# Patient Record
Sex: Female | Born: 1940
Health system: Southern US, Community
[De-identification: ages and names within clinical notes are randomized; demographics above are authoritative.]

## PROBLEM LIST (undated history)

## (undated) DIAGNOSIS — E119 Type 2 diabetes mellitus without complications: Secondary | ICD-10-CM

## (undated) DIAGNOSIS — D259 Leiomyoma of uterus, unspecified: Secondary | ICD-10-CM

## (undated) DIAGNOSIS — R002 Palpitations: Secondary | ICD-10-CM

## (undated) DIAGNOSIS — E079 Disorder of thyroid, unspecified: Secondary | ICD-10-CM

## (undated) DIAGNOSIS — E785 Hyperlipidemia, unspecified: Secondary | ICD-10-CM

## (undated) DIAGNOSIS — T7840XA Allergy, unspecified, initial encounter: Secondary | ICD-10-CM

## (undated) DIAGNOSIS — J45909 Unspecified asthma, uncomplicated: Secondary | ICD-10-CM

## (undated) DIAGNOSIS — I10 Essential (primary) hypertension: Secondary | ICD-10-CM

## (undated) DIAGNOSIS — R609 Edema, unspecified: Secondary | ICD-10-CM

## (undated) HISTORY — DX: Type 2 diabetes mellitus without complications: E11.9

## (undated) HISTORY — DX: Essential (primary) hypertension: I10

## (undated) HISTORY — DX: Allergy, unspecified, initial encounter: T78.40XA

## (undated) HISTORY — DX: Disorder of thyroid, unspecified: E07.9

## (undated) HISTORY — DX: Palpitations: R00.2

## (undated) HISTORY — DX: Unspecified asthma, uncomplicated: J45.909

## (undated) HISTORY — DX: Hyperlipidemia, unspecified: E78.5

## (undated) HISTORY — DX: Edema, unspecified: R60.9

## (undated) HISTORY — DX: Leiomyoma of uterus, unspecified: D25.9

---

## 1970-11-09 HISTORY — PX: BREAST SURGERY: SHX581

## 1999-03-20 ENCOUNTER — Other Ambulatory Visit: Admission: RE | Admit: 1999-03-20 | Discharge: 1999-03-20 | Payer: Self-pay | Admitting: Family Medicine

## 2004-07-10 ENCOUNTER — Emergency Department (HOSPITAL_COMMUNITY): Admission: EM | Admit: 2004-07-10 | Discharge: 2004-07-10 | Payer: Self-pay | Admitting: Emergency Medicine

## 2005-02-28 ENCOUNTER — Emergency Department (HOSPITAL_COMMUNITY): Admission: EM | Admit: 2005-02-28 | Discharge: 2005-02-28 | Payer: Self-pay | Admitting: Internal Medicine

## 2005-03-07 ENCOUNTER — Ambulatory Visit (HOSPITAL_COMMUNITY): Admission: RE | Admit: 2005-03-07 | Discharge: 2005-03-07 | Payer: Self-pay | Admitting: *Deleted

## 2005-06-03 ENCOUNTER — Other Ambulatory Visit: Admission: RE | Admit: 2005-06-03 | Discharge: 2005-06-03 | Payer: Self-pay | Admitting: Family Medicine

## 2005-06-25 ENCOUNTER — Ambulatory Visit (HOSPITAL_COMMUNITY): Admission: RE | Admit: 2005-06-25 | Discharge: 2005-06-25 | Payer: Self-pay | Admitting: Family Medicine

## 2010-11-30 ENCOUNTER — Encounter: Payer: Self-pay | Admitting: Family Medicine

## 2017-09-03 DIAGNOSIS — I10 Essential (primary) hypertension: Secondary | ICD-10-CM | POA: Diagnosis not present

## 2017-09-03 DIAGNOSIS — E119 Type 2 diabetes mellitus without complications: Secondary | ICD-10-CM | POA: Diagnosis not present

## 2017-09-08 DIAGNOSIS — R9431 Abnormal electrocardiogram [ECG] [EKG]: Secondary | ICD-10-CM | POA: Diagnosis not present

## 2017-09-08 DIAGNOSIS — I358 Other nonrheumatic aortic valve disorders: Secondary | ICD-10-CM | POA: Diagnosis not present

## 2017-09-08 DIAGNOSIS — I10 Essential (primary) hypertension: Secondary | ICD-10-CM | POA: Diagnosis not present

## 2017-09-17 DIAGNOSIS — I10 Essential (primary) hypertension: Secondary | ICD-10-CM | POA: Diagnosis not present

## 2017-09-20 DIAGNOSIS — R9431 Abnormal electrocardiogram [ECG] [EKG]: Secondary | ICD-10-CM | POA: Diagnosis not present

## 2017-09-20 DIAGNOSIS — I10 Essential (primary) hypertension: Secondary | ICD-10-CM | POA: Diagnosis not present

## 2017-09-27 DIAGNOSIS — R9431 Abnormal electrocardiogram [ECG] [EKG]: Secondary | ICD-10-CM | POA: Diagnosis not present

## 2017-09-27 DIAGNOSIS — I10 Essential (primary) hypertension: Secondary | ICD-10-CM | POA: Diagnosis not present

## 2017-09-29 DIAGNOSIS — I351 Nonrheumatic aortic (valve) insufficiency: Secondary | ICD-10-CM | POA: Diagnosis not present

## 2017-09-29 DIAGNOSIS — R9431 Abnormal electrocardiogram [ECG] [EKG]: Secondary | ICD-10-CM | POA: Diagnosis not present

## 2017-09-29 DIAGNOSIS — R0602 Shortness of breath: Secondary | ICD-10-CM | POA: Diagnosis not present

## 2017-09-29 DIAGNOSIS — I10 Essential (primary) hypertension: Secondary | ICD-10-CM | POA: Diagnosis not present

## 2017-10-05 DIAGNOSIS — I10 Essential (primary) hypertension: Secondary | ICD-10-CM | POA: Diagnosis not present

## 2017-10-07 DIAGNOSIS — R1900 Intra-abdominal and pelvic swelling, mass and lump, unspecified site: Secondary | ICD-10-CM | POA: Diagnosis not present

## 2017-10-07 DIAGNOSIS — Z124 Encounter for screening for malignant neoplasm of cervix: Secondary | ICD-10-CM | POA: Diagnosis not present

## 2017-10-07 DIAGNOSIS — Z01419 Encounter for gynecological examination (general) (routine) without abnormal findings: Secondary | ICD-10-CM | POA: Diagnosis not present

## 2017-10-08 ENCOUNTER — Other Ambulatory Visit: Payer: Self-pay | Admitting: Obstetrics & Gynecology

## 2017-10-08 DIAGNOSIS — D259 Leiomyoma of uterus, unspecified: Secondary | ICD-10-CM

## 2017-10-14 DIAGNOSIS — M810 Age-related osteoporosis without current pathological fracture: Secondary | ICD-10-CM | POA: Diagnosis not present

## 2017-10-14 DIAGNOSIS — Z1231 Encounter for screening mammogram for malignant neoplasm of breast: Secondary | ICD-10-CM | POA: Diagnosis not present

## 2017-10-26 DIAGNOSIS — Z1211 Encounter for screening for malignant neoplasm of colon: Secondary | ICD-10-CM | POA: Diagnosis not present

## 2017-10-26 DIAGNOSIS — Z1212 Encounter for screening for malignant neoplasm of rectum: Secondary | ICD-10-CM | POA: Diagnosis not present

## 2017-10-29 ENCOUNTER — Ambulatory Visit
Admission: RE | Admit: 2017-10-29 | Discharge: 2017-10-29 | Disposition: A | Discharge status: Home / Self Care | Payer: Medicare Other | Source: Ambulatory Visit | Attending: Obstetrics & Gynecology | Admitting: Obstetrics & Gynecology

## 2017-10-29 DIAGNOSIS — D259 Leiomyoma of uterus, unspecified: Secondary | ICD-10-CM

## 2017-10-29 MED ORDER — GADOBENATE DIMEGLUMINE 529 MG/ML IV SOLN
13.0000 mL | Freq: Once | INTRAVENOUS | Status: AC | PRN
  Administered 2017-10-29: 13 mL via INTRAVENOUS

## 2017-11-08 DIAGNOSIS — I10 Essential (primary) hypertension: Secondary | ICD-10-CM | POA: Diagnosis not present

## 2017-11-10 ENCOUNTER — Encounter: Payer: Self-pay | Admitting: Gynecologic Oncology

## 2017-11-10 ENCOUNTER — Ambulatory Visit: Payer: Medicare Other | Attending: Gynecologic Oncology | Admitting: Gynecologic Oncology

## 2017-11-10 VITALS — BP 148/80 | HR 59 | Temp 97.8°F | Resp 18 | Ht 60.0 in | Wt 134.5 lb

## 2017-11-10 DIAGNOSIS — E785 Hyperlipidemia, unspecified: Secondary | ICD-10-CM | POA: Insufficient documentation

## 2017-11-10 DIAGNOSIS — N951 Menopausal and female climacteric states: Secondary | ICD-10-CM | POA: Diagnosis not present

## 2017-11-10 DIAGNOSIS — Z91013 Allergy to seafood: Secondary | ICD-10-CM | POA: Insufficient documentation

## 2017-11-10 DIAGNOSIS — I10 Essential (primary) hypertension: Secondary | ICD-10-CM | POA: Insufficient documentation

## 2017-11-10 DIAGNOSIS — E119 Type 2 diabetes mellitus without complications: Secondary | ICD-10-CM | POA: Diagnosis not present

## 2017-11-10 DIAGNOSIS — E079 Disorder of thyroid, unspecified: Secondary | ICD-10-CM | POA: Insufficient documentation

## 2017-11-10 DIAGNOSIS — D251 Intramural leiomyoma of uterus: Secondary | ICD-10-CM | POA: Diagnosis not present

## 2017-11-10 DIAGNOSIS — Z79899 Other long term (current) drug therapy: Secondary | ICD-10-CM | POA: Diagnosis not present

## 2017-11-10 DIAGNOSIS — J45909 Unspecified asthma, uncomplicated: Secondary | ICD-10-CM | POA: Diagnosis not present

## 2017-11-10 NOTE — Patient Instructions (Signed)
Dr. Alycia Rossetti recommends a complete hysterectomy with ovaries and tubes removed.  She will send Dr. Benjie Karvonen as well as your PCP and Cardiologist her note from today with recommendations. Dr. Benjie Karvonen is able to perform this surgery for you as Dr. Alycia Rossetti stated in the visit. Call our office [phone] if you have any questions.

## 2017-11-10 NOTE — Progress Notes (Signed)
Consult Note: Gyn-Onc  Becki Mccaskill 77 y.o. female  CC:  Chief Complaint  Patient presents with  . Fibroids, intramural    HPI: Patient is seen today in consultation at the request of Dr. Benjie Karvonen. Primary physician Dr. Beatrix Shipper. Cardiologist Dr. Koleen Nimrod.  Patient is a very pleasant 77 year old gravida 1 para 1 who is referred to [place] secondary to uterine fibroids. The long history of uterine myomas that she feels really stopped getting bigger about 4 years ago. Please review her imaging results below.  07/10/04: CT: Markedly enlarged uterus.  Large dense calcifications in both ovaries.  06/25/05 ultrasound: The uterus is enlarged measuring at least 13 cm in length x 9.0 cm in AP diameter x 10.0 cm in transverse diameter.  A large fibroid is seen centrally within the uterus which measures approximately 8.7 x 8.0 x 7.7 cm.  This is located centrally in the uterus and likely displaces the endometrial cavity.  Endometrium was not visualized by either transabdominal or transvaginal sonography. In addition, there are calcified masses in the posterolateral aspect of the uterine body on both the right and left sides with the right side measuring approximately 3.4 x 1.8 x 2.4 cm and the left side measuring approximately 3.0 x 3.1 x 2.0 cm.  These are consistent with calcified fibroids, and are stable when compared with prior CT on 07/10/04.   Neither ovary is directly visualized by either transabdominal or transvaginal sonography, however no adnexal masses are identified.  There is no evidence of free fluid. IMPRESSION: 1.  Enlarged uterus with dominant fibroid centrally measuring 8 cm, and smaller 2 to 3 cm calcified fibroids in the posterior uterine body. 2.  Nonvisualization of the ovaries.  No adnexal mass or free fluid identified.  10/29/17 MRI: Reproductive: Large uterine leiomyoma expands the uterus out of the pelvis and above the umbilicus. Leiomyoma / uterus measures 18.1 by  17.2 by 12.3 cm (volume = 2000 cm^3). This is significantly increased from approximately 10.9 by 9.3 by 10.0 cm (volume = 530 cm^3) on CT 07/10/2004 .  The leiomyoma enhance uniformly. The endometrium is not well appreciated but appears to be anterior inferior relation to the leiomyoma (image 16, series 3).  The LEFT fallopian tube is dilated (image 11, series 5) to 1 to 2.5 cm. The ovary is not well identified. The RIGHT ovary is likewise difficult to identified  IMPRESSION: 1. Large intramural leiomyoma extending out of pelvis to the level of the umbilicus with calculated volume 2000 ml and increased from significantly CT 2005. 2. LEFT hydrosalpinx.  Ovaries are not well identified. 3. Normal kidneys. 4. Normal liver, biliary tree pancreas.  She had a CA-125 performed that was normal at 18.9. LDH was normal at 191 her Pap smear was negative.  See her today regarding the concern of a sarcoma.  Patient is otherwise completely asymptomatic as stated above she's feels a bit stopped getting bigger about 4-5 years ago. She's never had any subjective symptoms or bleeding. She went through hot flashes in her mid 64s and stopped having periods in 2000. She never took any hormone replacement therapy and had no bleeding since that time. She has recently had a cardiac exam and she went in to get her teeth cleaned in September was noted to have elevated blood pressure. This persisted through October and she was referred to the cardiologist to perform a stress echo. For her report the stress echo was negative and she was started on amlodipine. She is up-to-date  on her screening with a negative mammogram in the fall. She recently sent in the cologuard testing. She has not heard back yet.  There is no cancer in her family.  Review of Systems: Constitutional: Denies fever. Skin: No rash Cardiovascular: No chest pain, shortness of breath, or edema  Pulmonary: No cough Gastro Intestinal: No abdominal  pain. No nausea, vomiting, constipation, or diarrhea reported.  Genitourinary: No frequency or urgency.  Denies vaginal bleeding and discharge.  Musculoskeletal: No joint swelling or pain.  Psychology: No complaints  Current Meds:  Outpatient Encounter Medications as of 11/10/2017  Medication Sig  . amLODipine (NORVASC) 10 MG tablet Take 10 mg by mouth daily.  Marland Kitchen atenolol (TENORMIN) 25 MG tablet Take 25 mg by mouth daily.  . irbesartan-hydrochlorothiazide (AVALIDE) 150-12.5 MG tablet Take 1 tablet by mouth daily.  Marland Kitchen spironolactone (ALDACTONE) 25 MG tablet Take 25 mg by mouth daily.   No facility-administered encounter medications on file as of 11/10/2017.     Allergy:  Allergies  Allergen Reactions  . Iodine   . Other     Dust   . Shellfish Allergy   . Tetracyclines & Related     Social Hx:   Social History   Socioeconomic History  . Marital status: Married    Spouse name: Not on file  . Number of children: Not on file  . Years of education: Not on file  . Highest education level: Not on file  Social Needs  . Financial resource strain: Not on file  . Food insecurity - worry: Not on file  . Food insecurity - inability: Not on file  . Transportation needs - medical: Not on file  . Transportation needs - non-medical: Not on file  Occupational History  . Not on file  Tobacco Use  . Smoking status: Never Smoker  . Smokeless tobacco: Never Used  Substance and Sexual Activity  . Alcohol use: No    Frequency: Never  . Drug use: No  . Sexual activity: Not on file  Other Topics Concern  . Not on file  Social History Narrative  . Not on file    Past Surgical Hx:  Past Surgical History:  Procedure Laterality Date  . BREAST SURGERY  1972   Right breast tissue removal    Past Medical Hx:  Past Medical History:  Diagnosis Date  . Allergy   . Asthma   . Diabetes mellitus without complication ([place])   . Edema   . Hyperlipidemia   . Hypertension   . Palpitations   .  Thyroid disease   . Uterine fibroid     Oncology Hx:   No history exists.    Family Hx:  Family History  Problem Relation Age of Onset  . Heart attack Mother   . Seizures Mother     Vitals:  Blood pressure (!) 148/80, pulse (!) 59, temperature 97.8 F (36.6 C), temperature source Oral, resp. rate 18, height 5' (1.524 m), weight 134 lb 8 oz (61 kg), SpO2 99 %.  Physical Exam:  Well-nourished well-developed female who appears younger than stated age in no acute distress.  Neck: Supple, no lymphadenopathy, no thyromegaly.  Lungs: Clear to auscultation bilaterally.  Cardiac: Regular rate rhythm.  Abdomen: Large abdominal pelvic mass palpable to level of the umbilicus. Abdomen is soft, nontender, nondistended. There is no hepatomegaly.  Groins: No lymphadenopathy.  Extremities no edema.  Pelvic: External genitalia within normal limits. Bimanual examination feels a large abdominal pelvic mass most likely  consistent with her uterus filling the pelvis. It is freely mobile and comes to level of the umbilicus. There is no distinct adnexal pathology separate from this mass. It is mobile. Rectovaginal examination confirms no nodularity.  Assessment/Plan: 77 year old is referred to [place] for evaluation and consideration of hysterectomy. I believe the patient is a excellent surgical candidate. Her METs or greater than 4 and she's recently had a cardiac evaluation, which per her report was unremarkable. While she is fairly asymptomatic I think is reasonable to move forward with surgery as she is fairly healthy and as she gets older if her health declined and she developed symptoms that may be at higher risk for surgery. I do not think that this represents a malignancy. Even if there is evidence of sarcoma on final pathology, we would not do anything different than a total abdominal hysterectomy bilateral salpingo-oophorectomy. There is no role for staging in uterine sarcoma.  I think that it  woudl be reasonable for this to be done with her primary gynecologist. The patient's questions were elicited namely is there are nonsurgical option. I discussed with her that uterine artery embolization is an option but I would not necessarily advocate that for her. She states that she will discuss this with her husband and her son and will contact Dr. Benjie Karvonen in follow-up.  Again her questions were elicited in answer to her satisfaction. She has our contact information if she has any questions.  It was a pleasure to participate in the care of this very pleasant patient.  Shahir Karen A., MD 11/10/2017, 1:05 PM

## 2017-11-18 ENCOUNTER — Telehealth: Payer: Self-pay | Admitting: *Deleted

## 2017-11-18 NOTE — Telephone Encounter (Signed)
Seth Bake from [facility] called regarding the patient's appt with Dr. Alycia Rossetti. Seth Bake needed the information regarding the patient's discharge instructions. Per the office note I informed Seth Bake that Dr.Gehrig stated "Dr. Benjie Karvonen can do the surgery, and that the patient was going to discuss this with her husband and son. Then call Dr.Mody's office." Informed Seth Bake that the office note was also routed to Dr. Benjie Karvonen.

## 2017-12-09 DIAGNOSIS — I7300 Raynaud's syndrome without gangrene: Secondary | ICD-10-CM | POA: Diagnosis not present

## 2017-12-09 DIAGNOSIS — I10 Essential (primary) hypertension: Secondary | ICD-10-CM | POA: Diagnosis not present

## 2018-01-27 DIAGNOSIS — I7300 Raynaud's syndrome without gangrene: Secondary | ICD-10-CM | POA: Diagnosis not present

## 2018-01-27 DIAGNOSIS — M818 Other osteoporosis without current pathological fracture: Secondary | ICD-10-CM | POA: Diagnosis not present

## 2018-01-27 DIAGNOSIS — I10 Essential (primary) hypertension: Secondary | ICD-10-CM | POA: Diagnosis not present

## 2018-01-27 DIAGNOSIS — Z0000 Encounter for general adult medical examination without abnormal findings: Secondary | ICD-10-CM | POA: Diagnosis not present

## 2018-04-28 DIAGNOSIS — I10 Essential (primary) hypertension: Secondary | ICD-10-CM | POA: Diagnosis not present

## 2018-04-28 DIAGNOSIS — M818 Other osteoporosis without current pathological fracture: Secondary | ICD-10-CM | POA: Diagnosis not present

## 2018-08-30 DIAGNOSIS — I7300 Raynaud's syndrome without gangrene: Secondary | ICD-10-CM | POA: Diagnosis not present

## 2018-08-30 DIAGNOSIS — M818 Other osteoporosis without current pathological fracture: Secondary | ICD-10-CM | POA: Diagnosis not present

## 2018-08-30 DIAGNOSIS — I10 Essential (primary) hypertension: Secondary | ICD-10-CM | POA: Diagnosis not present

## 2018-10-17 DIAGNOSIS — Z1231 Encounter for screening mammogram for malignant neoplasm of breast: Secondary | ICD-10-CM | POA: Diagnosis not present

## 2019-01-31 DIAGNOSIS — I739 Peripheral vascular disease, unspecified: Secondary | ICD-10-CM | POA: Diagnosis not present

## 2019-01-31 DIAGNOSIS — M818 Other osteoporosis without current pathological fracture: Secondary | ICD-10-CM | POA: Diagnosis not present

## 2019-01-31 DIAGNOSIS — I10 Essential (primary) hypertension: Secondary | ICD-10-CM | POA: Diagnosis not present

## 2019-03-03 DIAGNOSIS — M818 Other osteoporosis without current pathological fracture: Secondary | ICD-10-CM | POA: Diagnosis not present

## 2019-03-03 DIAGNOSIS — I739 Peripheral vascular disease, unspecified: Secondary | ICD-10-CM | POA: Diagnosis not present

## 2019-03-03 DIAGNOSIS — I10 Essential (primary) hypertension: Secondary | ICD-10-CM | POA: Diagnosis not present

## 2019-04-14 DIAGNOSIS — I10 Essential (primary) hypertension: Secondary | ICD-10-CM | POA: Diagnosis not present

## 2019-04-14 DIAGNOSIS — Z0000 Encounter for general adult medical examination without abnormal findings: Secondary | ICD-10-CM | POA: Diagnosis not present

## 2019-06-02 DIAGNOSIS — I10 Essential (primary) hypertension: Secondary | ICD-10-CM | POA: Diagnosis not present

## 2019-06-08 DIAGNOSIS — I10 Essential (primary) hypertension: Secondary | ICD-10-CM | POA: Diagnosis not present

## 2019-10-02 DIAGNOSIS — I10 Essential (primary) hypertension: Secondary | ICD-10-CM | POA: Diagnosis not present

## 2019-10-02 DIAGNOSIS — M180 Bilateral primary osteoarthritis of first carpometacarpal joints: Secondary | ICD-10-CM | POA: Diagnosis not present

## 2019-10-02 DIAGNOSIS — E785 Hyperlipidemia, unspecified: Secondary | ICD-10-CM | POA: Diagnosis not present

## 2019-10-02 DIAGNOSIS — E781 Pure hyperglyceridemia: Secondary | ICD-10-CM | POA: Diagnosis not present

## 2019-10-02 IMAGING — MR MR PELVIS WO/W CM
11 of 17 series · 28 of 48 positions shown · IV contrast (multihance)
Comparison: Pelvic ultrasound 06/25/2005, CT 07/10/2004

CLINICAL DATA: Follow-up uterine fibroid. Fibroid discovered 9
years prior. Mid substance currently.

EXAM:
MRI ABDOMEN AND PELVIS WITHOUT AND WITH CONTRAST
TECHNIQUE: Multiplanar multisequence MR imaging of the abdomen and pelvis was
performed both before and after the administration of intravenous
contrast.
CONTRAST:  13mL MULTIHANCE GADOBENATE DIMEGLUMINE 529 MG/ML IV SOLN

[Series 2: cor haste · coronal · 5.0mm · 0.68mm/px · 2 of 33 slices shown]
[im 1/33]
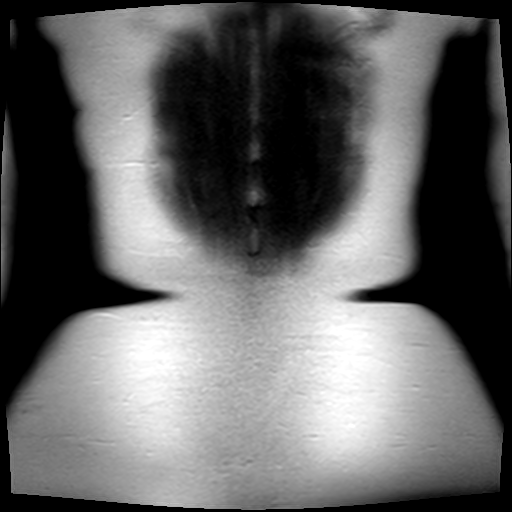
[im 33/33]
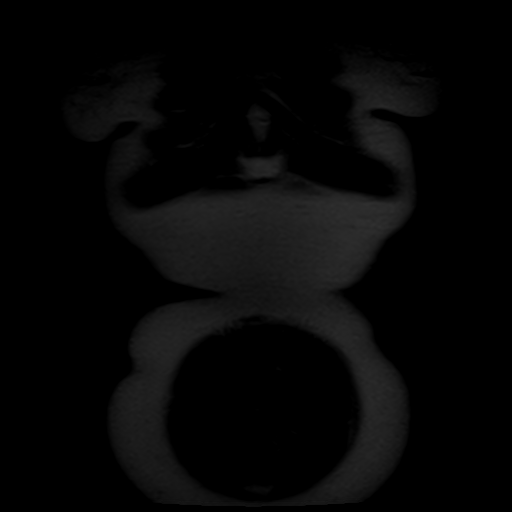

[Series 4: T1 · axial · 5.5mm · 0.70mm/px · z∈[-17,+177]mm · 4 of 66 slices shown]
[im 1/66]
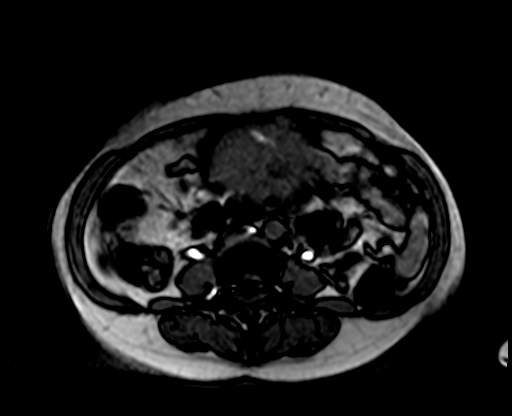
[im 22/66]
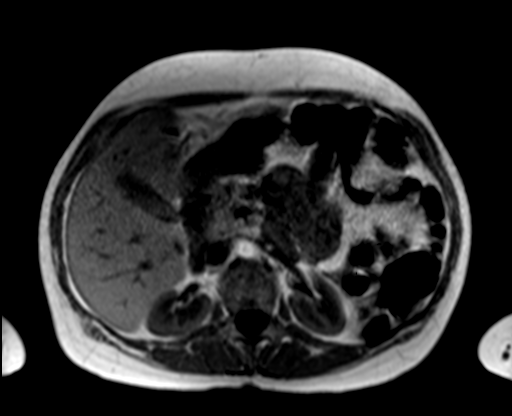
[im 44/66]
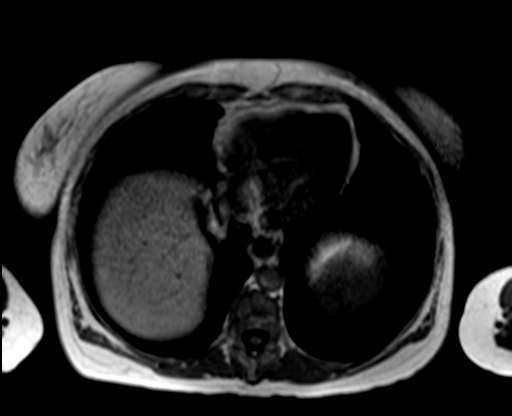
[im 66/66]
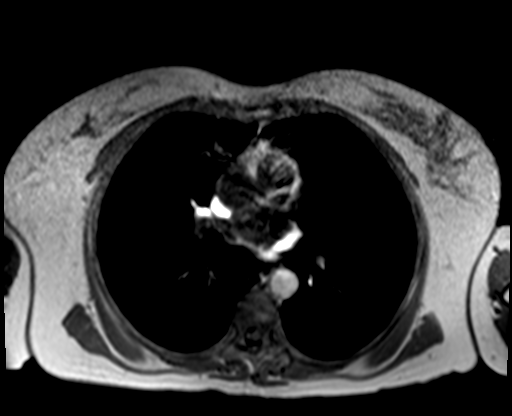

[Series 6: axial haste · axial · 5.5mm · 0.70mm/px · z∈[-17,+177]mm · 2 of 33 slices shown]
[im 1/33]
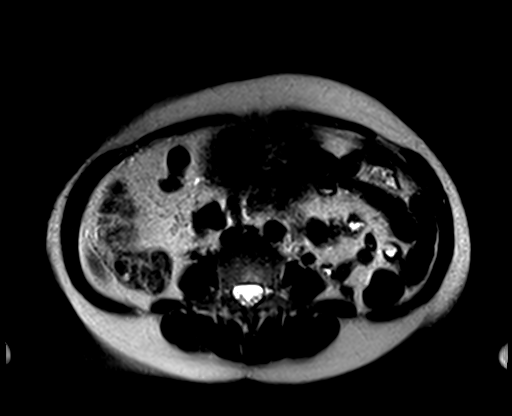
[im 33/33]
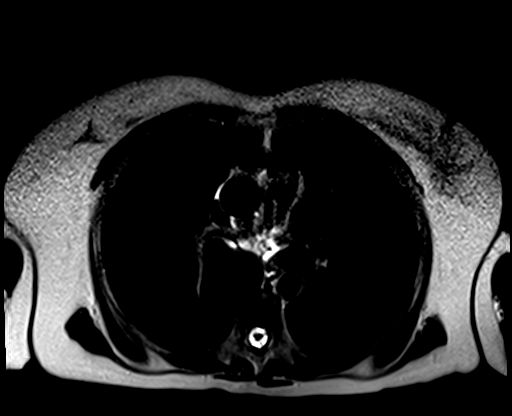

[Series 7: T2 · axial · 5.5mm · 1.12mm/px · z∈[-26,+186]mm · 2 of 33 slices shown]
[im 1/33]
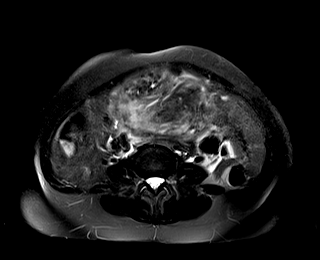
[im 33/33]
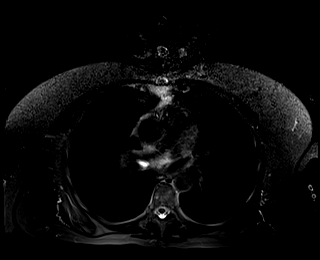

[Series 8: [id number]_trig · axial · 5.5mm · 1.88mm/px · z∈[-26,+186]mm · 5 of 98 slices shown]
[im 1/98]
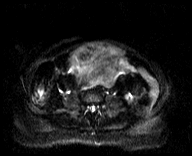
[im 25/98]
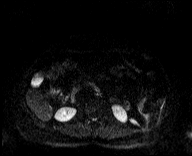
[im 49/98]
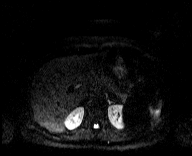
[im 73/98]
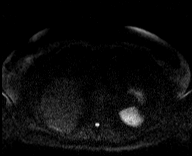
[im 98/98]
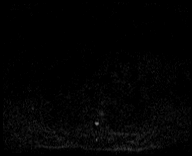

[Series 9: [id number]_trig_adc · axial · 5.5mm · 1.88mm/px · 1 of 33 slices shown]
[im 1/33]
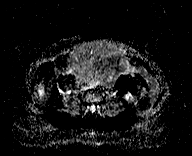

[Series 10: bSSFP · axial · 4.0mm · 0.70mm/px · z∈[-21,+183]mm · 2 of 52 slices shown]
[im 1/52]
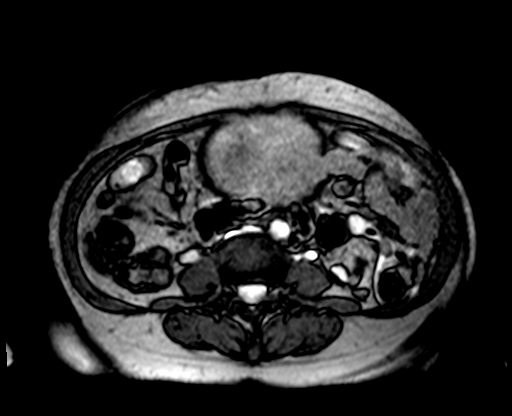
[im 52/52]
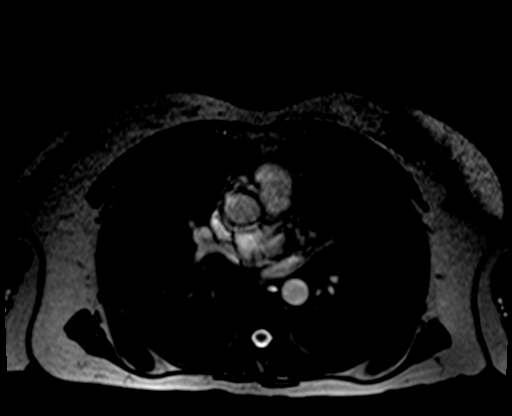

[Series 11: T1 dynamic · axial · non-contrast · 2.5mm · 0.70mm/px · z∈[-38,+180]mm · 3 of 88 slices shown]
[im 1/88]
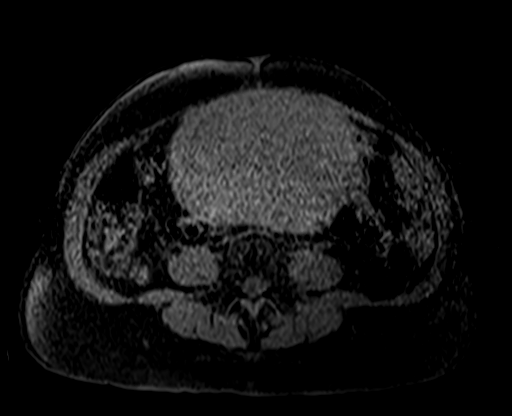
[im 44/88]
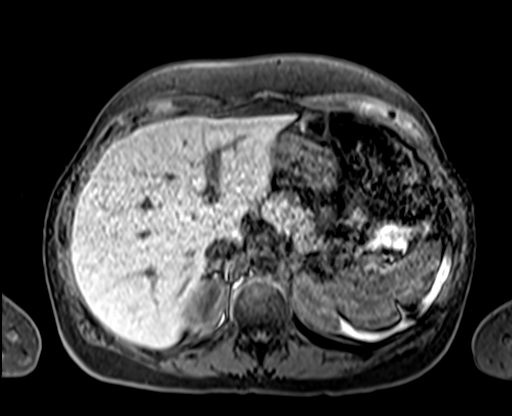
[im 88/88]
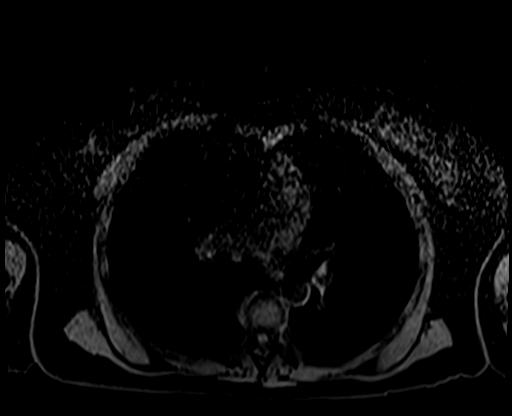

[Series 12: T1 dynamic post-contrast · axial · 2.5mm · 0.70mm/px · z∈[-38,+180]mm · 3 of 88 slices shown (1 of 3)]
[im 1/88]
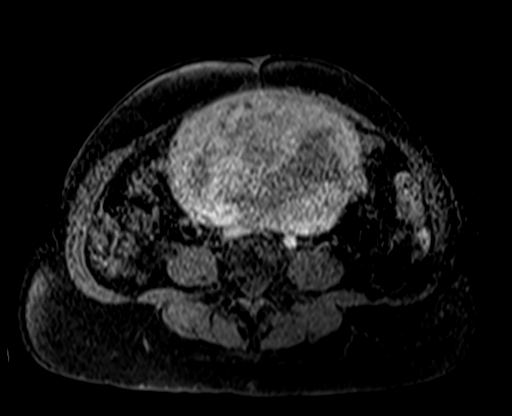
[im 44/88]
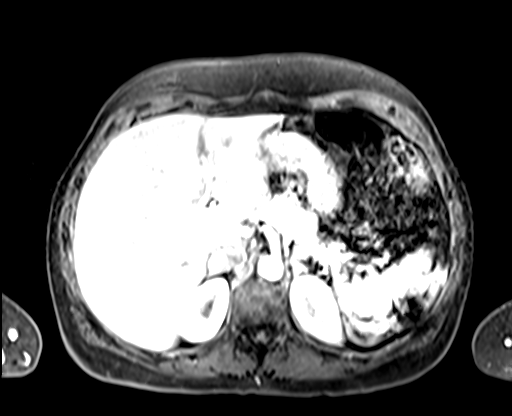
[im 88/88]
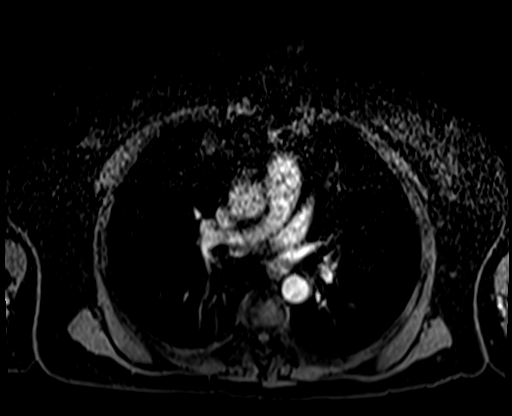

[Series 13: T1 dynamic post-contrast · axial · 2.5mm · 0.70mm/px · z∈[-38,+180]mm · 3 of 88 slices shown (2 of 3)]
[im 1/88]
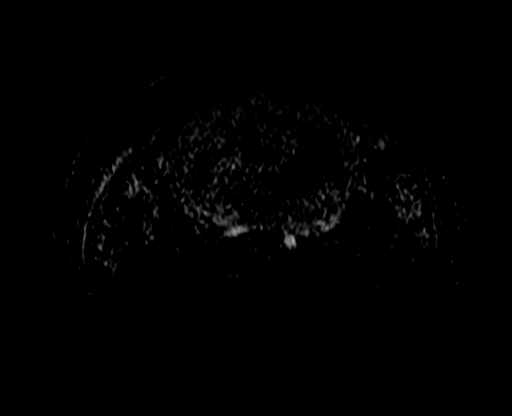
[im 44/88]
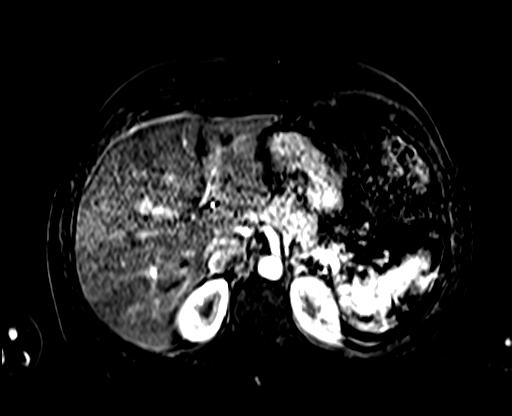
[im 88/88]
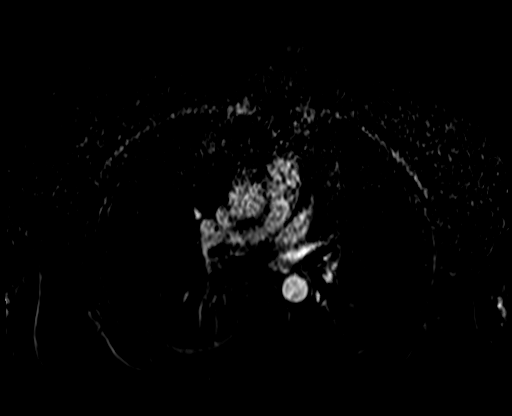

[Series 14: T1 dynamic post-contrast · axial · 2.5mm · 0.70mm/px · 1 of 88 slices shown (3 of 3)]
[im 1/88]
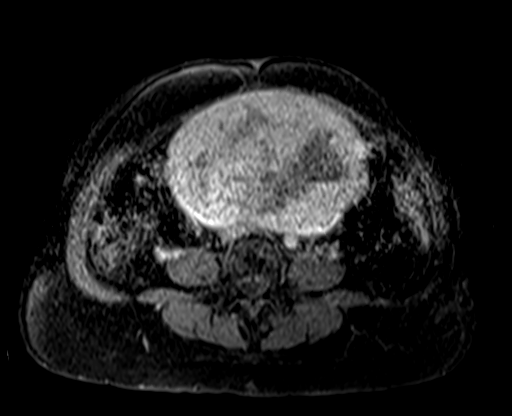

[28 of 48 positions shown; findings below may reference images not displayed]

FINDINGS: COMBINED FINDINGS FOR BOTH MR ABDOMEN AND PELVIS

Lower chest: Lung bases are clear.

Hepatobiliary: No focal hepatic lesion. No biliary duct dilatation.
Gallbladder is normal. Common bile duct is normal.

Pancreas: Pancreas is normal. No ductal dilatation. No pancreatic
inflammation.

Spleen: Normal spleen

Adrenals/urinary tract: Adrenal glands and kidneys are normal.
Ureters normal. The bladder is flattened by the large uterine
fibroid extends the posterior RIGHT pelvis.

Stomach/Bowel: Stomach, small bowel, appendix, and cecum are normal.
The colon and rectosigmoid colon are normal.

Vascular/Lymphatic: Abdominal aorta is normal caliber. There is no
retroperitoneal or periportal lymphadenopathy. No pelvic
lymphadenopathy.

Reproductive: Large uterine leiomyoma expands the uterus out of the
pelvis and above the umbilicus. Leiomyoma / uterus measures 18.1 by
17.2 by 12.3 cm (volume = 1888 cm^3). This is significantly
increased from approximately 10.9 by 9.3 by 10.0 cm (volume = 530
cm^3) on CT 07/10/2004 .

The leiomyoma enhance uniformly. The endometrium is not well
appreciated but appears to be anterior inferior relation to the
leiomyoma (image 16, series 3).

The LEFT fallopian tube is dilated (image 11, series 5) to 1 to
cm. The ovary is not well identified. The RIGHT ovary is likewise
difficult to identified

Other: No free fluid the pelvis for

Musculoskeletal: No aggressive osseous lesion no
IMPRESSION: 1. Large intramural leiomyoma extending out of pelvis to the level
of the umbilicus with calculated volume 1888 ml and increased from
significantly CT [DATE]. LEFT hydrosalpinx.  Ovaries are not well identified.
3. Normal kidneys.
4. Normal liver, biliary tree pancreas.

## 2019-10-19 DIAGNOSIS — Z1231 Encounter for screening mammogram for malignant neoplasm of breast: Secondary | ICD-10-CM | POA: Diagnosis not present

## 2020-01-30 DIAGNOSIS — E785 Hyperlipidemia, unspecified: Secondary | ICD-10-CM | POA: Diagnosis not present

## 2020-01-30 DIAGNOSIS — I10 Essential (primary) hypertension: Secondary | ICD-10-CM | POA: Diagnosis not present

## 2020-03-01 DIAGNOSIS — E7800 Pure hypercholesterolemia, unspecified: Secondary | ICD-10-CM | POA: Diagnosis not present

## 2020-03-01 DIAGNOSIS — I10 Essential (primary) hypertension: Secondary | ICD-10-CM | POA: Diagnosis not present

## 2020-05-03 DIAGNOSIS — E785 Hyperlipidemia, unspecified: Secondary | ICD-10-CM | POA: Diagnosis not present

## 2020-05-03 DIAGNOSIS — I10 Essential (primary) hypertension: Secondary | ICD-10-CM | POA: Diagnosis not present

## 2020-05-03 DIAGNOSIS — I7300 Raynaud's syndrome without gangrene: Secondary | ICD-10-CM | POA: Diagnosis not present

## 2020-05-03 DIAGNOSIS — I739 Peripheral vascular disease, unspecified: Secondary | ICD-10-CM | POA: Diagnosis not present

## 2020-05-03 DIAGNOSIS — Z0000 Encounter for general adult medical examination without abnormal findings: Secondary | ICD-10-CM | POA: Diagnosis not present

## 2020-05-03 DIAGNOSIS — E7800 Pure hypercholesterolemia, unspecified: Secondary | ICD-10-CM | POA: Diagnosis not present

## 2020-09-09 DIAGNOSIS — Z23 Encounter for immunization: Secondary | ICD-10-CM | POA: Diagnosis not present

## 2020-09-23 DIAGNOSIS — H2513 Age-related nuclear cataract, bilateral: Secondary | ICD-10-CM | POA: Diagnosis not present

## 2020-09-23 DIAGNOSIS — H401131 Primary open-angle glaucoma, bilateral, mild stage: Secondary | ICD-10-CM | POA: Diagnosis not present

## 2020-09-23 DIAGNOSIS — I10 Essential (primary) hypertension: Secondary | ICD-10-CM | POA: Diagnosis not present

## 2020-10-14 DIAGNOSIS — H2512 Age-related nuclear cataract, left eye: Secondary | ICD-10-CM | POA: Diagnosis not present

## 2020-10-14 DIAGNOSIS — I10 Essential (primary) hypertension: Secondary | ICD-10-CM | POA: Diagnosis not present

## 2020-10-14 DIAGNOSIS — H2513 Age-related nuclear cataract, bilateral: Secondary | ICD-10-CM | POA: Diagnosis not present

## 2020-10-14 DIAGNOSIS — H401131 Primary open-angle glaucoma, bilateral, mild stage: Secondary | ICD-10-CM | POA: Diagnosis not present

## 2020-10-21 DIAGNOSIS — Z1231 Encounter for screening mammogram for malignant neoplasm of breast: Secondary | ICD-10-CM | POA: Diagnosis not present

## 2020-10-25 DIAGNOSIS — R928 Other abnormal and inconclusive findings on diagnostic imaging of breast: Secondary | ICD-10-CM | POA: Diagnosis not present

## 2020-10-25 DIAGNOSIS — R922 Inconclusive mammogram: Secondary | ICD-10-CM | POA: Diagnosis not present

## 2020-11-14 DIAGNOSIS — H2512 Age-related nuclear cataract, left eye: Secondary | ICD-10-CM | POA: Diagnosis not present

## 2020-11-15 DIAGNOSIS — H2511 Age-related nuclear cataract, right eye: Secondary | ICD-10-CM | POA: Diagnosis not present

## 2020-11-28 DIAGNOSIS — H2511 Age-related nuclear cataract, right eye: Secondary | ICD-10-CM | POA: Diagnosis not present

## 2021-02-04 DIAGNOSIS — E785 Hyperlipidemia, unspecified: Secondary | ICD-10-CM | POA: Diagnosis not present

## 2021-02-04 DIAGNOSIS — I10 Essential (primary) hypertension: Secondary | ICD-10-CM | POA: Diagnosis not present

## 2021-02-04 DIAGNOSIS — I739 Peripheral vascular disease, unspecified: Secondary | ICD-10-CM | POA: Diagnosis not present

## 2021-02-07 DIAGNOSIS — E785 Hyperlipidemia, unspecified: Secondary | ICD-10-CM | POA: Diagnosis not present

## 2021-02-07 DIAGNOSIS — I10 Essential (primary) hypertension: Secondary | ICD-10-CM | POA: Diagnosis not present

## 2021-06-09 DIAGNOSIS — E7800 Pure hypercholesterolemia, unspecified: Secondary | ICD-10-CM | POA: Diagnosis not present

## 2021-06-09 DIAGNOSIS — I10 Essential (primary) hypertension: Secondary | ICD-10-CM | POA: Diagnosis not present

## 2021-06-17 DIAGNOSIS — Z1212 Encounter for screening for malignant neoplasm of rectum: Secondary | ICD-10-CM | POA: Diagnosis not present

## 2021-06-17 DIAGNOSIS — Z1211 Encounter for screening for malignant neoplasm of colon: Secondary | ICD-10-CM | POA: Diagnosis not present

## 2021-09-30 DIAGNOSIS — I739 Peripheral vascular disease, unspecified: Secondary | ICD-10-CM | POA: Diagnosis not present

## 2021-09-30 DIAGNOSIS — Z23 Encounter for immunization: Secondary | ICD-10-CM | POA: Diagnosis not present

## 2021-09-30 DIAGNOSIS — I10 Essential (primary) hypertension: Secondary | ICD-10-CM | POA: Diagnosis not present

## 2021-09-30 DIAGNOSIS — E7800 Pure hypercholesterolemia, unspecified: Secondary | ICD-10-CM | POA: Diagnosis not present

## 2021-10-09 DIAGNOSIS — I10 Essential (primary) hypertension: Secondary | ICD-10-CM | POA: Diagnosis not present

## 2021-10-09 DIAGNOSIS — E7800 Pure hypercholesterolemia, unspecified: Secondary | ICD-10-CM | POA: Diagnosis not present

## 2021-10-09 DIAGNOSIS — I7300 Raynaud's syndrome without gangrene: Secondary | ICD-10-CM | POA: Diagnosis not present

## 2021-10-09 DIAGNOSIS — M818 Other osteoporosis without current pathological fracture: Secondary | ICD-10-CM | POA: Diagnosis not present

## 2021-10-22 DIAGNOSIS — Z1231 Encounter for screening mammogram for malignant neoplasm of breast: Secondary | ICD-10-CM | POA: Diagnosis not present

## 2021-10-22 DIAGNOSIS — Z780 Asymptomatic menopausal state: Secondary | ICD-10-CM | POA: Diagnosis not present

## 2021-10-22 DIAGNOSIS — M810 Age-related osteoporosis without current pathological fracture: Secondary | ICD-10-CM | POA: Diagnosis not present

## 2022-03-09 DIAGNOSIS — I10 Essential (primary) hypertension: Secondary | ICD-10-CM | POA: Diagnosis not present

## 2022-03-09 DIAGNOSIS — Z6821 Body mass index (BMI) 21.0-21.9, adult: Secondary | ICD-10-CM | POA: Diagnosis not present

## 2022-03-09 DIAGNOSIS — E785 Hyperlipidemia, unspecified: Secondary | ICD-10-CM | POA: Diagnosis not present

## 2022-08-20 DIAGNOSIS — E785 Hyperlipidemia, unspecified: Secondary | ICD-10-CM | POA: Diagnosis not present

## 2022-08-20 DIAGNOSIS — I10 Essential (primary) hypertension: Secondary | ICD-10-CM | POA: Diagnosis not present

## 2022-08-28 DIAGNOSIS — Z23 Encounter for immunization: Secondary | ICD-10-CM | POA: Diagnosis not present

## 2022-08-28 DIAGNOSIS — E785 Hyperlipidemia, unspecified: Secondary | ICD-10-CM | POA: Diagnosis not present

## 2022-08-28 DIAGNOSIS — M818 Other osteoporosis without current pathological fracture: Secondary | ICD-10-CM | POA: Diagnosis not present

## 2022-08-28 DIAGNOSIS — I10 Essential (primary) hypertension: Secondary | ICD-10-CM | POA: Diagnosis not present

## 2022-09-18 DIAGNOSIS — Z0000 Encounter for general adult medical examination without abnormal findings: Secondary | ICD-10-CM | POA: Diagnosis not present

## 2022-09-18 DIAGNOSIS — E785 Hyperlipidemia, unspecified: Secondary | ICD-10-CM | POA: Diagnosis not present

## 2022-09-18 DIAGNOSIS — I10 Essential (primary) hypertension: Secondary | ICD-10-CM | POA: Diagnosis not present

## 2022-10-28 DIAGNOSIS — Z1231 Encounter for screening mammogram for malignant neoplasm of breast: Secondary | ICD-10-CM | POA: Diagnosis not present

## 2023-01-26 DIAGNOSIS — R7303 Prediabetes: Secondary | ICD-10-CM | POA: Diagnosis not present

## 2023-01-26 DIAGNOSIS — E785 Hyperlipidemia, unspecified: Secondary | ICD-10-CM | POA: Diagnosis not present

## 2023-01-26 DIAGNOSIS — E559 Vitamin D deficiency, unspecified: Secondary | ICD-10-CM | POA: Diagnosis not present

## 2023-01-26 DIAGNOSIS — I10 Essential (primary) hypertension: Secondary | ICD-10-CM | POA: Diagnosis not present

## 2023-01-28 DIAGNOSIS — I10 Essential (primary) hypertension: Secondary | ICD-10-CM | POA: Diagnosis not present

## 2023-01-28 DIAGNOSIS — E785 Hyperlipidemia, unspecified: Secondary | ICD-10-CM | POA: Diagnosis not present

## 2023-02-18 DIAGNOSIS — I7300 Raynaud's syndrome without gangrene: Secondary | ICD-10-CM | POA: Diagnosis not present

## 2023-02-18 DIAGNOSIS — I739 Peripheral vascular disease, unspecified: Secondary | ICD-10-CM | POA: Diagnosis not present

## 2023-02-18 DIAGNOSIS — E785 Hyperlipidemia, unspecified: Secondary | ICD-10-CM | POA: Diagnosis not present

## 2023-02-18 DIAGNOSIS — I10 Essential (primary) hypertension: Secondary | ICD-10-CM | POA: Diagnosis not present

## 2023-05-06 DIAGNOSIS — M818 Other osteoporosis without current pathological fracture: Secondary | ICD-10-CM | POA: Diagnosis not present

## 2023-05-06 DIAGNOSIS — E785 Hyperlipidemia, unspecified: Secondary | ICD-10-CM | POA: Diagnosis not present

## 2023-05-06 DIAGNOSIS — E559 Vitamin D deficiency, unspecified: Secondary | ICD-10-CM | POA: Diagnosis not present

## 2023-05-06 DIAGNOSIS — I10 Essential (primary) hypertension: Secondary | ICD-10-CM | POA: Diagnosis not present

## 2023-05-06 DIAGNOSIS — I7300 Raynaud's syndrome without gangrene: Secondary | ICD-10-CM | POA: Diagnosis not present

## 2023-06-15 DIAGNOSIS — I10 Essential (primary) hypertension: Secondary | ICD-10-CM | POA: Diagnosis not present

## 2023-06-15 DIAGNOSIS — I7300 Raynaud's syndrome without gangrene: Secondary | ICD-10-CM | POA: Diagnosis not present

## 2023-06-15 DIAGNOSIS — R7303 Prediabetes: Secondary | ICD-10-CM | POA: Diagnosis not present

## 2023-06-15 DIAGNOSIS — M818 Other osteoporosis without current pathological fracture: Secondary | ICD-10-CM | POA: Diagnosis not present

## 2023-06-15 DIAGNOSIS — E785 Hyperlipidemia, unspecified: Secondary | ICD-10-CM | POA: Diagnosis not present

## 2023-06-17 DIAGNOSIS — M818 Other osteoporosis without current pathological fracture: Secondary | ICD-10-CM | POA: Diagnosis not present

## 2023-06-17 DIAGNOSIS — E785 Hyperlipidemia, unspecified: Secondary | ICD-10-CM | POA: Diagnosis not present

## 2023-06-17 DIAGNOSIS — I10 Essential (primary) hypertension: Secondary | ICD-10-CM | POA: Diagnosis not present

## 2023-06-17 DIAGNOSIS — E559 Vitamin D deficiency, unspecified: Secondary | ICD-10-CM | POA: Diagnosis not present

## 2023-10-12 DIAGNOSIS — E782 Mixed hyperlipidemia: Secondary | ICD-10-CM | POA: Diagnosis not present

## 2023-10-12 DIAGNOSIS — I10 Essential (primary) hypertension: Secondary | ICD-10-CM | POA: Diagnosis not present

## 2023-10-18 DIAGNOSIS — E785 Hyperlipidemia, unspecified: Secondary | ICD-10-CM | POA: Diagnosis not present

## 2023-10-18 DIAGNOSIS — Z0000 Encounter for general adult medical examination without abnormal findings: Secondary | ICD-10-CM | POA: Diagnosis not present

## 2023-10-18 DIAGNOSIS — I10 Essential (primary) hypertension: Secondary | ICD-10-CM | POA: Diagnosis not present

## 2023-11-01 DIAGNOSIS — Z1231 Encounter for screening mammogram for malignant neoplasm of breast: Secondary | ICD-10-CM | POA: Diagnosis not present

## 2023-11-01 DIAGNOSIS — M8588 Other specified disorders of bone density and structure, other site: Secondary | ICD-10-CM | POA: Diagnosis not present

## 2023-11-01 DIAGNOSIS — Z7952 Long term (current) use of systemic steroids: Secondary | ICD-10-CM | POA: Diagnosis not present

## 2024-03-06 DIAGNOSIS — E785 Hyperlipidemia, unspecified: Secondary | ICD-10-CM | POA: Diagnosis not present

## 2024-03-06 DIAGNOSIS — R7309 Other abnormal glucose: Secondary | ICD-10-CM | POA: Diagnosis not present

## 2024-03-06 DIAGNOSIS — E559 Vitamin D deficiency, unspecified: Secondary | ICD-10-CM | POA: Diagnosis not present

## 2024-03-06 DIAGNOSIS — I10 Essential (primary) hypertension: Secondary | ICD-10-CM | POA: Diagnosis not present

## 2024-03-13 DIAGNOSIS — I129 Hypertensive chronic kidney disease with stage 1 through stage 4 chronic kidney disease, or unspecified chronic kidney disease: Secondary | ICD-10-CM | POA: Diagnosis not present

## 2024-03-13 DIAGNOSIS — R7303 Prediabetes: Secondary | ICD-10-CM | POA: Diagnosis not present

## 2024-03-13 DIAGNOSIS — E782 Mixed hyperlipidemia: Secondary | ICD-10-CM | POA: Diagnosis not present

## 2024-03-13 DIAGNOSIS — N1831 Chronic kidney disease, stage 3a: Secondary | ICD-10-CM | POA: Diagnosis not present

## 2024-04-04 DIAGNOSIS — I129 Hypertensive chronic kidney disease with stage 1 through stage 4 chronic kidney disease, or unspecified chronic kidney disease: Secondary | ICD-10-CM | POA: Diagnosis not present

## 2024-04-04 DIAGNOSIS — R7303 Prediabetes: Secondary | ICD-10-CM | POA: Diagnosis not present

## 2024-04-04 DIAGNOSIS — N1831 Chronic kidney disease, stage 3a: Secondary | ICD-10-CM | POA: Diagnosis not present

## 2024-05-05 DIAGNOSIS — I10 Essential (primary) hypertension: Secondary | ICD-10-CM | POA: Diagnosis not present

## 2024-05-05 DIAGNOSIS — Z91148 Patient's other noncompliance with medication regimen for other reason: Secondary | ICD-10-CM | POA: Diagnosis not present

## 2024-06-05 DIAGNOSIS — I10 Essential (primary) hypertension: Secondary | ICD-10-CM | POA: Diagnosis not present

## 2024-07-17 DIAGNOSIS — I129 Hypertensive chronic kidney disease with stage 1 through stage 4 chronic kidney disease, or unspecified chronic kidney disease: Secondary | ICD-10-CM | POA: Diagnosis not present

## 2024-07-17 DIAGNOSIS — E782 Mixed hyperlipidemia: Secondary | ICD-10-CM | POA: Diagnosis not present

## 2024-07-17 DIAGNOSIS — N1831 Chronic kidney disease, stage 3a: Secondary | ICD-10-CM | POA: Diagnosis not present

## 2024-07-17 DIAGNOSIS — E785 Hyperlipidemia, unspecified: Secondary | ICD-10-CM | POA: Diagnosis not present

## 2024-07-17 DIAGNOSIS — Z23 Encounter for immunization: Secondary | ICD-10-CM | POA: Diagnosis not present

## 2024-08-07 DIAGNOSIS — E782 Mixed hyperlipidemia: Secondary | ICD-10-CM | POA: Diagnosis not present

## 2024-08-07 DIAGNOSIS — I10 Essential (primary) hypertension: Secondary | ICD-10-CM | POA: Diagnosis not present

## 2024-08-07 DIAGNOSIS — R7303 Prediabetes: Secondary | ICD-10-CM | POA: Diagnosis not present
